# Patient Record
Sex: Female | Born: 1995 | Race: White | Hispanic: No | Marital: Single | Smoking: Former smoker
Health system: Southern US, Community
[De-identification: ages and names within clinical notes are randomized; demographics above are authoritative.]

## PROBLEM LIST (undated history)

## (undated) DIAGNOSIS — F32A Depression, unspecified: Secondary | ICD-10-CM

---

## 2016-03-14 ENCOUNTER — Emergency Department (HOSPITAL_BASED_OUTPATIENT_CLINIC_OR_DEPARTMENT_OTHER)
Admission: EM | Admit: 2016-03-14 | Discharge: 2016-03-14 | Disposition: A | Discharge status: Home / Self Care | Payer: Medicaid - Out of State | Attending: Dermatology | Admitting: Dermatology

## 2016-03-14 ENCOUNTER — Encounter (HOSPITAL_BASED_OUTPATIENT_CLINIC_OR_DEPARTMENT_OTHER): Payer: Self-pay | Admitting: Emergency Medicine

## 2016-03-14 DIAGNOSIS — Z5321 Procedure and treatment not carried out due to patient leaving prior to being seen by health care provider: Secondary | ICD-10-CM | POA: Diagnosis not present

## 2016-03-14 DIAGNOSIS — N939 Abnormal uterine and vaginal bleeding, unspecified: Secondary | ICD-10-CM | POA: Diagnosis not present

## 2016-03-14 DIAGNOSIS — F17210 Nicotine dependence, cigarettes, uncomplicated: Secondary | ICD-10-CM | POA: Insufficient documentation

## 2016-03-14 LAB — URINALYSIS, MICROSCOPIC (REFLEX)

## 2016-03-14 LAB — URINALYSIS, ROUTINE W REFLEX MICROSCOPIC
GLUCOSE, UA: NEGATIVE mg/dL
Ketones, ur: 15 mg/dL — AB
Nitrite: NEGATIVE
PH: 6 (ref 5.0–8.0)
Protein, ur: 30 mg/dL — AB
SPECIFIC GRAVITY, URINE: 1.035 — AB (ref 1.005–1.030)

## 2016-03-14 LAB — PREGNANCY, URINE: Preg Test, Ur: NEGATIVE

## 2016-03-14 NOTE — ED Triage Notes (Signed)
Patient reports menstrual cycle is 1 week late.  Patient states that she passed a "piece of body tissue" today.  States this was bloody and it "had blue veins in it".  States that she also has been feeling like she has been having contractions.  Reports miscarriage in October.

## 2016-03-14 NOTE — ED Notes (Signed)
No answer when called for tx area 

## 2019-12-13 ENCOUNTER — Emergency Department (HOSPITAL_BASED_OUTPATIENT_CLINIC_OR_DEPARTMENT_OTHER)
Admission: EM | Admit: 2019-12-13 | Discharge: 2019-12-13 | Disposition: A | Discharge status: Home / Self Care | Payer: Medicaid - Out of State | Attending: Emergency Medicine | Admitting: Emergency Medicine

## 2019-12-13 ENCOUNTER — Other Ambulatory Visit: Payer: Self-pay

## 2019-12-13 ENCOUNTER — Emergency Department (HOSPITAL_BASED_OUTPATIENT_CLINIC_OR_DEPARTMENT_OTHER): Payer: Medicaid - Out of State

## 2019-12-13 ENCOUNTER — Encounter (HOSPITAL_BASED_OUTPATIENT_CLINIC_OR_DEPARTMENT_OTHER): Payer: Self-pay | Admitting: *Deleted

## 2019-12-13 DIAGNOSIS — Y92009 Unspecified place in unspecified non-institutional (private) residence as the place of occurrence of the external cause: Secondary | ICD-10-CM | POA: Diagnosis not present

## 2019-12-13 DIAGNOSIS — R0782 Intercostal pain: Secondary | ICD-10-CM | POA: Diagnosis present

## 2019-12-13 DIAGNOSIS — W19XXXA Unspecified fall, initial encounter: Secondary | ICD-10-CM | POA: Diagnosis not present

## 2019-12-13 DIAGNOSIS — R0781 Pleurodynia: Secondary | ICD-10-CM

## 2019-12-13 DIAGNOSIS — Z87891 Personal history of nicotine dependence: Secondary | ICD-10-CM | POA: Diagnosis not present

## 2019-12-13 HISTORY — DX: Depression, unspecified: F32.A

## 2019-12-13 NOTE — ED Notes (Signed)
Pt discharged to home. Discharge instructions have been discussed with patient and/or family members. Pt verbally acknowledges understanding d/c instructions, and endorses comprehension to checkout at registration before leaving.  °

## 2019-12-13 NOTE — ED Provider Notes (Addendum)
[facility] EMERGENCY DEPARTMENT Provider Note   CSN: [id number] Arrival date & time: 12/13/19  1428     History Chief Complaint  Patient presents with  . Back Injury    Kara Stone is a 24 y.o. female who presents with 3 hours of left rib pain, after a fall at home.  She states she was dismantling her dryer to repair it when she lost her footing, she fell backwards onto her buttocks with her back against the wall. The dryer reportedly fell against her left chest/side in the fall.   She reports severe pain immediately following the accident, and a few minutes of sensation of difficulty breathing.  At the time of my initial exam the patient reports she no longer feels short of breath, her pain is isolated to her left side and back and is improved at this time.  Pain is rated 6/10.  She denies chest pain at this time, shortness of breath, cough, nausea, vomiting.  She did not hit her head or experience LOC at the time of the fall.  She denies pain in any other part of her body.  I personally reviewed the patient's medical record.  She is otherwise an apparently healthy 24 year old.  History of breast augmentation 06/2019.   HPI     Past Medical History:  Diagnosis Date  . Depression     There are no problems to display for this patient.   History reviewed. No pertinent surgical history.   OB History   No obstetric history on file.     No family history on file.  Social History   Tobacco Use  . Smoking status: Former Smoker    Packs/day: 0.50    Types: Cigarettes  . Smokeless tobacco: Never Used  Vaping Use  . Vaping Use: Every day  Substance Use Topics  . Alcohol use: No  . Drug use: Yes    Types: Marijuana    Home Medications Prior to Admission medications   Medication Sig Start Date End Date Taking? Authorizing Provider  metroNIDAZOLE (FLAGYL) 500 MG tablet Take by mouth. 06/15/19  Yes [provider]    Allergies    Patient has no  known allergies.  Review of Systems   Review of Systems  Constitutional: Negative.   HENT: Negative.   Respiratory: Negative for cough, chest tightness and shortness of breath.   Cardiovascular: Negative for chest pain and palpitations.  Gastrointestinal: Negative for abdominal pain, diarrhea, nausea and vomiting.  Genitourinary: Negative.   Musculoskeletal: Positive for myalgias.       Left side pain.  Skin: Negative.   Neurological: Negative for dizziness, syncope, weakness, light-headedness and headaches.  Hematological: Negative.     Physical Exam Updated Vital Signs BP 122/70 (BP Location: Right Arm)   Pulse 75   Temp 98.1 F (36.7 C) (Oral)   Resp 18   Ht 5\' 5"  (1.651 m)   Wt 71.4 kg   LMP 11/29/2019   SpO2 100%   BMI 26.21 kg/m   Physical Exam Vitals and nursing note reviewed.  Constitutional:      Appearance: She is not ill-appearing.  HENT:     Head: Normocephalic and atraumatic.     Mouth/Throat:     Mouth: Mucous membranes are moist.     Pharynx: No oropharyngeal exudate or posterior oropharyngeal erythema.  Eyes:     General:        Right eye: No discharge.        Left  eye: No discharge.     Conjunctiva/sclera: Conjunctivae normal.     Pupils: Pupils are equal, round, and reactive to light.  Cardiovascular:     Rate and Rhythm: Normal rate and regular rhythm.     Pulses: Normal pulses.     Heart sounds: Normal heart sounds. No murmur heard.   Pulmonary:     Effort: Pulmonary effort is normal. No respiratory distress.     Breath sounds: Normal breath sounds. No wheezing, rhonchi or rales.  Chest:     Chest wall: No lacerations, deformity, swelling, tenderness, crepitus or edema.  Abdominal:     General: Bowel sounds are normal. There is no distension.     Palpations: Abdomen is soft.     Tenderness: There is no abdominal tenderness.  Musculoskeletal:        General: Tenderness present. No deformity.       Arms:     Cervical back: Neck supple.  No tenderness.     Right lower leg: No edema.     Left lower leg: No edema.  Skin:    General: Skin is warm and dry.     Capillary Refill: Capillary refill takes less than 2 seconds.     Comments: No erythema or ecchymosis over the left chest wall, side, back, flank.   Neurological:     General: No focal deficit present.     Mental Status: She is alert and oriented to person, place, and time. Mental status is at baseline.  Psychiatric:        Mood and Affect: Mood normal.     ED Results / Procedures / Treatments   Labs (all labs ordered are listed, but only abnormal results are displayed) Labs Reviewed - No data to display  EKG None  Radiology DG Ribs Unilateral W/Chest Left  Result Date: 12/13/2019 CLINICAL DATA:  Left chest pain after trauma. EXAM: LEFT RIBS AND CHEST - 3+ VIEW COMPARISON:  None. FINDINGS: Heart and mediastinal shadows are normal. The lungs are clear. No pneumothorax or hemothorax. Skin marker in place in the region of concern in the lower left ribs. No visible rib fracture. IMPRESSION: Normal. No active cardiopulmonary disease. No visible rib fracture. Electronically Signed   By: Paulina Fusi M.D.   On: 12/13/2019 15:14    Procedures Procedures (including critical care time)  Medications Ordered in ED Medications - No data to display  ED Course  I have reviewed the triage vital signs and the nursing notes.  Pertinent labs & imaging results that were available during my care of the patient were reviewed by me and considered in my medical decision making (see chart for details).    MDM Rules/Calculators/A&P                         Differential diagnosis for this patient with a blunt chest trauma includes is not limited to rib fracture, rib contusion, pneumothorax, intra-abdominal injury.   Patient's vital signs are all within normal limits.  At time of my examination of the patient she is resting comfortably in the triage room chair.  There is no  increased work of breathing, breath sounds are intact bilaterally.  Chest / rib x-ray without evidence of rib fracture.  I have personally reviewed this patient's image.  Given reassuring physical exam and x-ray, I do not feel any further work-up is necessary in the emergency department.  I discussed with patient that x-rays are not sufficiently sensitive  for detection of all rib fractures, however there is no evidence of pneumothorax or displaced rib fracture on her x-ray today.    It is likely this patient has some contusions to her left side and back secondary to fall/ blunt trauma. Patient may utilize NSAIDs, ice for pain management.  Tareka voiced understanding of her medical evaluation and treatment plan; each of her questions were answered to her expressed satisfaction.  She prefers to utilize over-the-counter ibuprofen that she has at home versus prescription pain medication.    Vital signs remained normal throughout the patient's stay in the emergency department.  Strict return precautions were given.  Patient stable for discharge at this time.  Final Clinical Impression(s) / ED Diagnoses Final diagnoses:  Rib pain    Rx / DC Orders ED Discharge Orders    None       Paris Lore, PA-C 12/13/19 1706    Demi Trieu, Eugene Gavia, PA-C 12/13/19 1708    Arby Barrette, MD 12/13/19 [phone]

## 2019-12-13 NOTE — Discharge Instructions (Addendum)
You were seen in the emergency department today for your left rib pain, after your fall.  Your physical exam and x-ray are very reassuring.  There is no obvious rib fractures on your x-ray.  You may take over-the-counter ibuprofen or Tylenol for your pain.  You may also ice the area for 15 to 20 minutes at a time, few times a day.   I recommend you return to [place] immediately should you develop any new shortness of breath, chest pain, difficulty breathing, nausea, vomiting, or other new severe symptoms.

## 2019-12-13 NOTE — ED Triage Notes (Signed)
Left rib injury with SOB. She was taking her dryer apart and it fell on top of her.

## 2020-04-18 ENCOUNTER — Encounter: Payer: Medicaid - Out of State | Admitting: Family Medicine

## 2021-10-07 IMAGING — CR DG RIBS W/ CHEST 3+V*L*
3 series · 3 of 3 positions shown · non-contrast
Comparison: None.

CLINICAL DATA: Left chest pain after trauma.

EXAM:
LEFT RIBS AND CHEST - 3+ VIEW

[w chest pa]
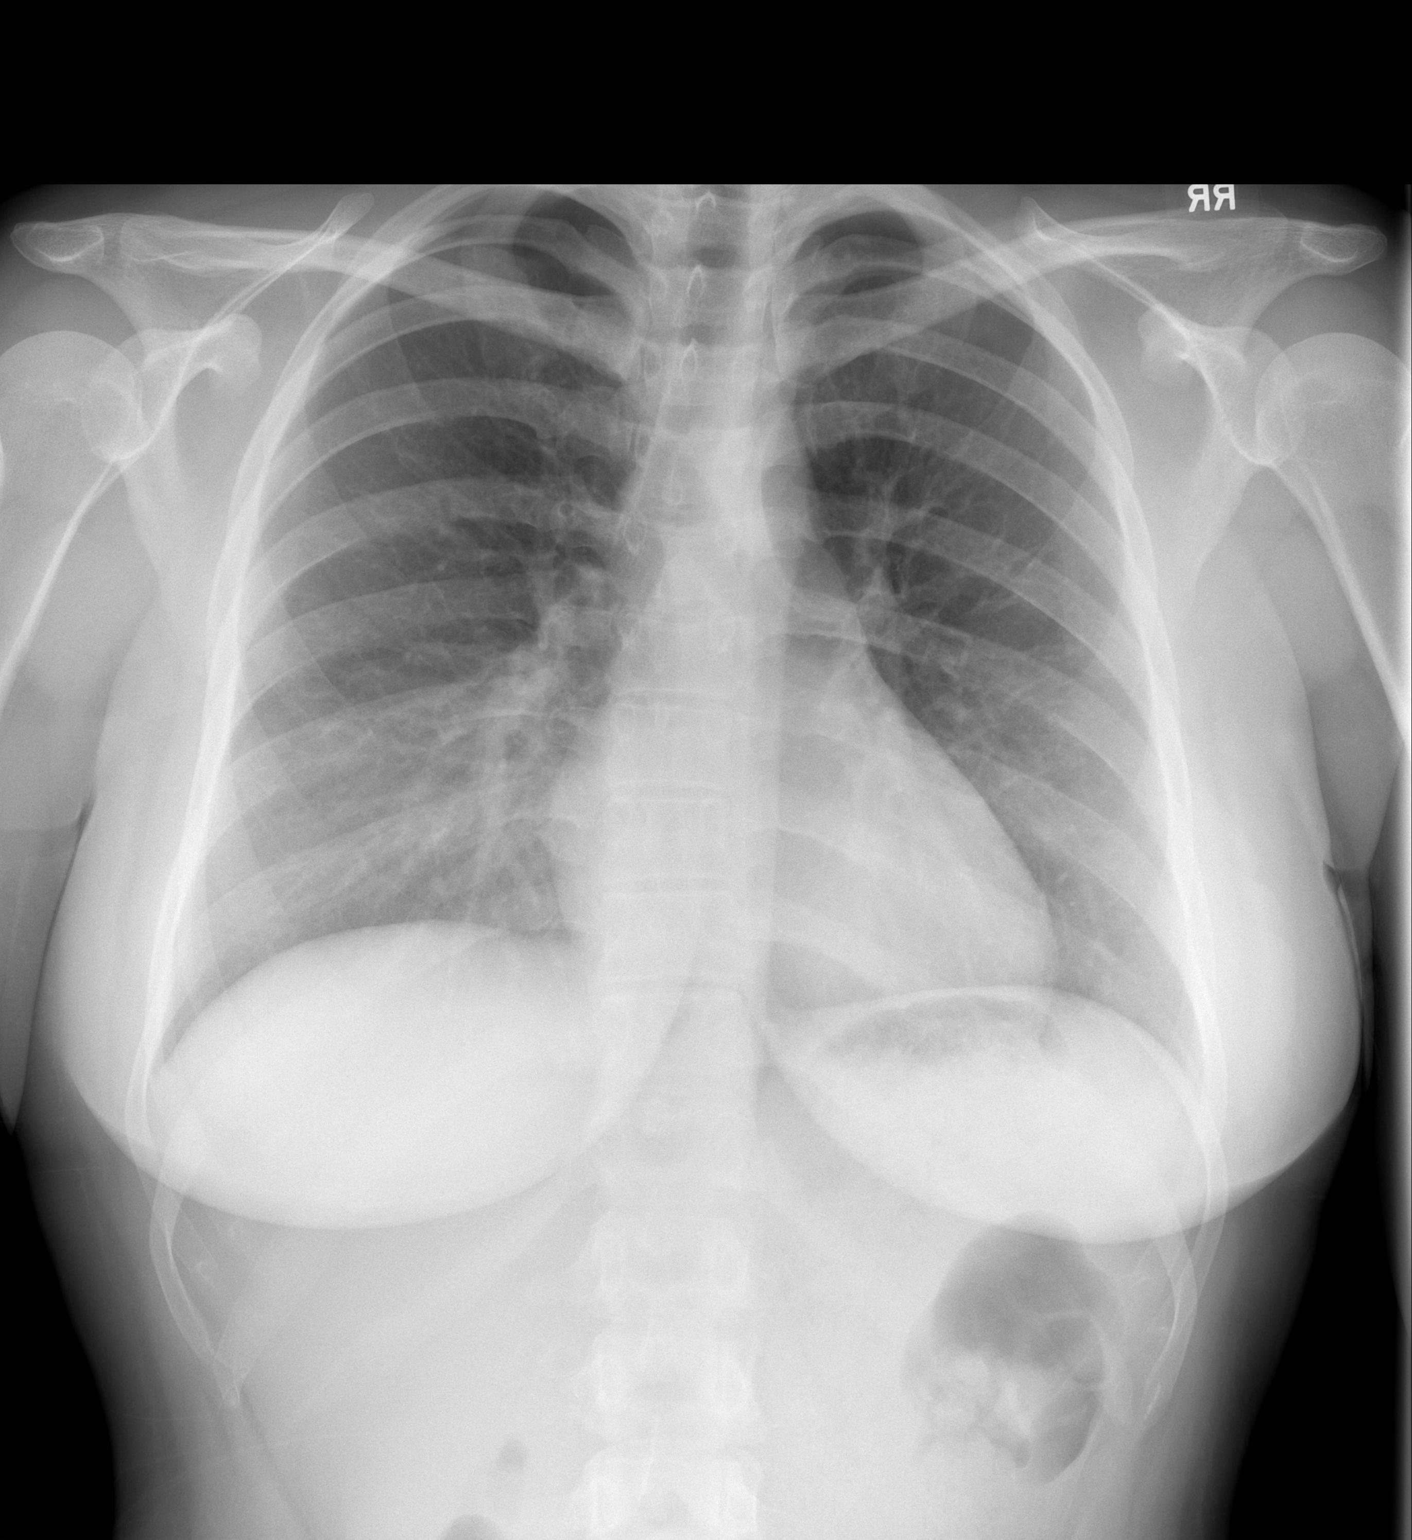

[w ribs ap/pa upper left]
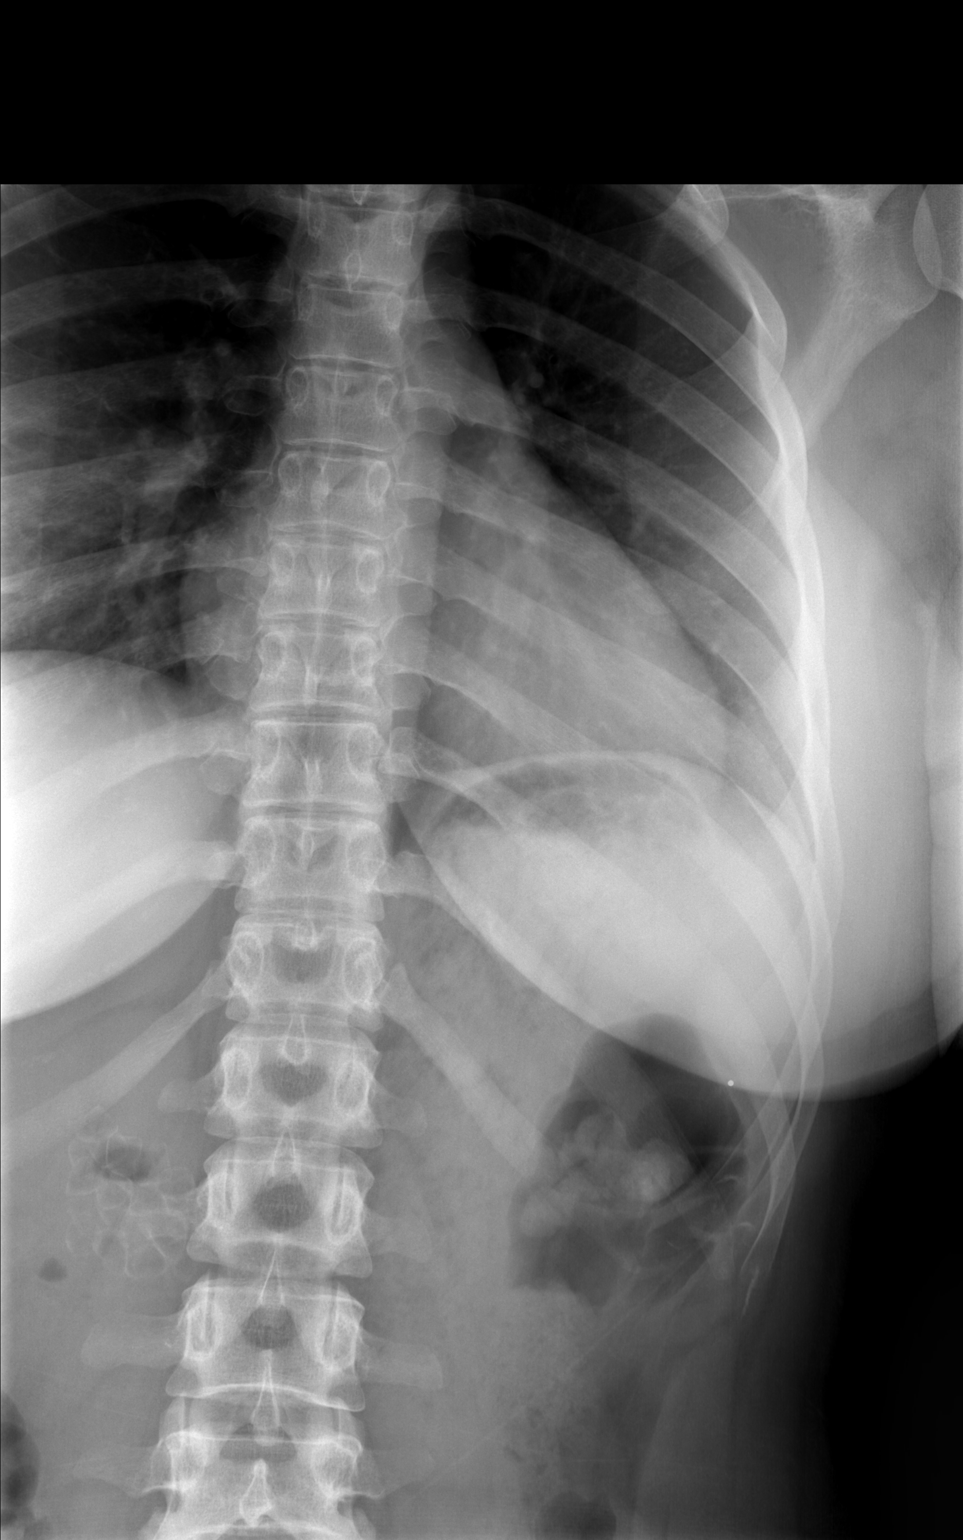

[w ribs ap/pa lower left]
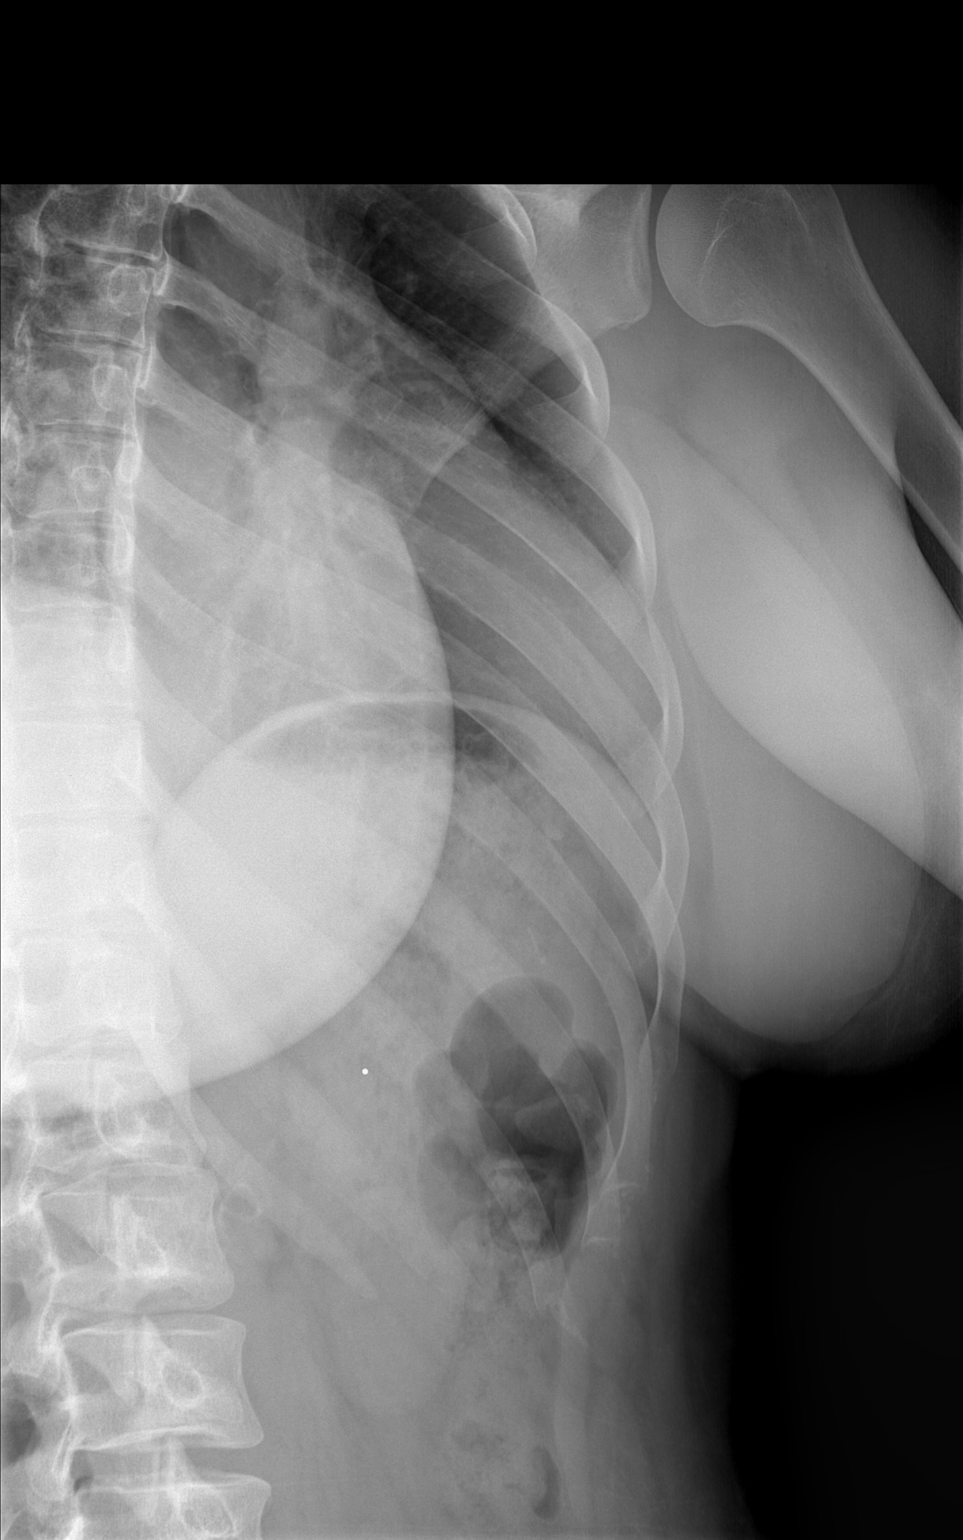

[3 of 3 positions shown; findings below may reference images not displayed]

FINDINGS: Heart and mediastinal shadows are normal. The lungs are clear. No
pneumothorax or hemothorax. Skin marker in place in the region of
concern in the lower left ribs. No visible rib fracture.
IMPRESSION: Normal. No active cardiopulmonary disease. No visible rib fracture.
